# Patient Record
Sex: Female | Born: 1960 | Race: Asian | Hispanic: No | Marital: Single | Smoking: Never smoker
Health system: Southern US, Community
[De-identification: ages and names within clinical notes are randomized; demographics above are authoritative.]

## PROBLEM LIST (undated history)

## (undated) ENCOUNTER — Ambulatory Visit (HOSPITAL_COMMUNITY): Payer: PRIVATE HEALTH INSURANCE

## (undated) DIAGNOSIS — Z8041 Family history of malignant neoplasm of ovary: Secondary | ICD-10-CM

## (undated) HISTORY — DX: Family history of malignant neoplasm of ovary: Z80.41

---

## 2001-01-07 ENCOUNTER — Emergency Department (HOSPITAL_COMMUNITY): Admission: EM | Admit: 2001-01-07 | Discharge: 2001-01-07 | Payer: Self-pay | Admitting: Emergency Medicine

## 2001-04-10 ENCOUNTER — Ambulatory Visit (HOSPITAL_COMMUNITY): Admission: RE | Admit: 2001-04-10 | Discharge: 2001-04-10 | Payer: Self-pay | Admitting: *Deleted

## 2001-08-17 ENCOUNTER — Inpatient Hospital Stay (HOSPITAL_COMMUNITY): Admission: AD | Admit: 2001-08-17 | Discharge: 2001-08-17 | Payer: Self-pay | Admitting: Obstetrics

## 2001-08-22 ENCOUNTER — Inpatient Hospital Stay (HOSPITAL_COMMUNITY): Admission: AD | Admit: 2001-08-22 | Discharge: 2001-08-25 | Payer: Self-pay | Admitting: *Deleted

## 2010-09-20 ENCOUNTER — Encounter: Payer: Self-pay | Admitting: Occupational Therapy

## 2012-01-23 ENCOUNTER — Emergency Department (HOSPITAL_COMMUNITY)
Admission: EM | Admit: 2012-01-23 | Discharge: 2012-01-23 | Disposition: A | Discharge status: Home / Self Care | Payer: Self-pay | Attending: Emergency Medicine | Admitting: Emergency Medicine

## 2012-01-23 ENCOUNTER — Encounter (HOSPITAL_COMMUNITY): Payer: Self-pay | Admitting: *Deleted

## 2012-01-23 DIAGNOSIS — K0889 Other specified disorders of teeth and supporting structures: Secondary | ICD-10-CM

## 2012-01-23 DIAGNOSIS — K029 Dental caries, unspecified: Secondary | ICD-10-CM | POA: Insufficient documentation

## 2012-01-23 DIAGNOSIS — F17200 Nicotine dependence, unspecified, uncomplicated: Secondary | ICD-10-CM | POA: Insufficient documentation

## 2012-01-23 MED ORDER — PENICILLIN V POTASSIUM 250 MG PO TABS: 250.0000 mg | ORAL_TABLET | Freq: Four times a day (QID) | ORAL | Status: AC

## 2012-01-23 MED ORDER — HYDROCODONE-ACETAMINOPHEN 5-325 MG PO TABS: 2.0000 | ORAL_TABLET | ORAL | Status: AC | PRN

## 2012-01-23 MED ORDER — IBUPROFEN 600 MG PO TABS: 600.0000 mg | ORAL_TABLET | Freq: Four times a day (QID) | ORAL | Status: AC | PRN

## 2012-01-23 NOTE — ED Notes (Signed)
The pt has had a toothache for 2 weeks 

## 2012-01-23 NOTE — ED Provider Notes (Signed)
History     CSN: [id number]  Arrival date & time 01/23/12  2146   First MD Initiated Contact with Patient 01/23/12 2217      Chief Complaint  Patient presents with  . Dental Pain     HPI Seven-day history of dental pain.  Has seen her dentist and schedule for a procedure in the next week.  No fever.  Increasing pain not relieved by Aleve.  No swelling or difficulty swallowing.  No difficulty breathing. History reviewed. No pertinent past medical history.  History reviewed. No pertinent past surgical history.  No family history on file.  History  Substance Use Topics  . Smoking status: Current Everyday Smoker  . Smokeless tobacco: Not on file  . Alcohol Use: No    OB History    Grav Para Term Preterm Abortions TAB SAB Ect Mult Living                  Review of Systems  All other systems reviewed and are negative.    Allergies  Review of patient's allergies indicates no known allergies.  Home Medications   Current Outpatient Rx  Name Route Sig Dispense Refill  . ASPIRIN EC 81 MG PO TBEC Oral Take 81 mg by mouth every 4 (four) hours as needed. For tooth pain    . NAPROXEN SODIUM 220 MG PO TABS Oral Take 220 mg by mouth 2 (two) times daily with a meal. For pain    . HYDROCODONE-ACETAMINOPHEN 5-325 MG PO TABS Oral Take 2 tablets by mouth every 4 (four) hours as needed for pain. 6 tablet 0  . IBUPROFEN 600 MG PO TABS Oral Take 1 tablet (600 mg total) by mouth every 6 (six) hours as needed for pain. 30 tablet 0  . PENICILLIN V POTASSIUM 250 MG PO TABS Oral Take 1 tablet (250 mg total) by mouth 4 (four) times daily. 40 tablet 0    BP 124/75  Pulse 80  Temp(Src) 98.2 F (36.8 C) (Oral)  Resp 18  SpO2 97%  Physical Exam  Nursing note and vitals reviewed. Constitutional: She is oriented to person, place, and time. She appears well-developed and well-nourished. No distress.  HENT:  Head: Normocephalic and atraumatic.  Mouth/Throat:    Eyes: Pupils are equal,  round, and reactive to light.  Neck: Normal range of motion.  Cardiovascular: Normal rate and intact distal pulses.   Pulmonary/Chest: No respiratory distress.  Abdominal: Normal appearance. She exhibits no distension.  Musculoskeletal: Normal range of motion.  Neurological: She is alert and oriented to person, place, and time. No cranial nerve deficit.  Skin: Skin is warm and dry. No rash noted.  Psychiatric: She has a normal mood and affect. Her behavior is normal.    ED Course  Procedures (including critical care time)  Labs Reviewed - No data to display No results found.   1. Toothache   2. Dental caries       MDM         Nelia Shi, MD 01/23/12 2233

## 2012-01-23 NOTE — ED Notes (Addendum)
Patient complaining of a toothache for the last two weeks in right lower portion of mouth; patient states that he has not been seen by a dentist and is trying to make an appointment.  Rates pain 10/10 on the numerical pain scale; describes pain as "throbbing".  Patient alert and oriented x4; PERRL present.  Will continue to monitor.

## 2012-01-23 NOTE — Discharge Instructions (Signed)
?  au R?ng (Dental Pain) B?n ? ???c Bc s? khm v b? ?au r?ng. ?au r?ng c th? do su r?ng gy ra, su r?ng lm dy th?n kinh c?a r?ng ti?p xc v?i khng kh v nhi?t ?? nng hay l?nh, do ? gy ?au. ?au r?ng c th? l do nhi?m trng ho?c p xe (cn ???c g?i l s?ng hay ung nh?t) xung quanh r?ng c?a b?n, c?ng th??ng do su r?ng gy ra. Nhi?m trng hay p xe quanh r?ng lm cho b?n b? ?au r?ng. CH?N ?ON Bc s? c th? ch?n ?on ?au r?ng th??ng b?ng cch khm r?ng. ?I?U TR?  N?u ?au r?ng do nhi?m trng, c th? dng thu?c khng sinh (thu?c gi?t vi trng) v thu?c gi?m ?au theo toa c?a Bc s? ?? ?i?u tr?. Hy u?ng thu?c theo h??ng d?n.   Ch? dng cc thu?c ???c bn khng c?n ??n thu?c c?a Bc s? ho?c theo toa c?a Bc s? ?? gi?m ?au, kh ch?u, hay s?t theo nh? h??ng d?n c?a Bc s?Theodoro Grist d b?nh ?au r?ng ngy hm nay l do nhi?m trng hay do b?nh l v? r?ng, hy nn ??n Nha s? khm cng s?m cng t?t ?? ???c ch?m [place] thm.  ?I KHM B?NH N?U: Khm v ?i?u tr? ?au r?ng cho b?n hm nay ch? l x? tr c?p c?u. ?y khng ph?i l bi?n php thay th? ch?m [place] s?c kh?e ho?c ch?m [place] r?ng ??y ??. N?u b?nh c?a b?n ngy cng n?ng h?n hay cc tri?u ch?ng (v?n ??) m?i xu?t hi?n, v b?n khng th? thu x?p ?? ti khm ngay v?i Nha s? c?a b?n, hy g?i ?i?n tho?i ho?c tr? l?i phng khm ny. TM ??N CH?M [place] Y T? NGAY L?P T?C N?U:  B?n b? s?t.   ?? v ph n? vng m?t, hm, hay c?.   Khng th? m? mi?ng ???c.   ?au d? d?i khng gi?m v?i thu?c gi?m ?au.  HY CH?C CH?N R?NG B?N:  Hi?u r nh?ng h??ng d?n khi xu?t vi?n.   S? theo di tnh tr?ng b?nh c?a b?n.   S? ??n khm b?nh ngay l?p t?c nh? ? ???c h??ng d?n.  Document Released: 08/16/2005 Document Revised: 08/05/2011 [facility] Patient Information 2012 [place], [place].

## 2015-02-28 HISTORY — PX: NO PAST SURGERIES: SHX2092

## 2015-03-14 ENCOUNTER — Encounter (HOSPITAL_COMMUNITY): Payer: Self-pay | Admitting: *Deleted

## 2015-03-14 ENCOUNTER — Emergency Department (HOSPITAL_COMMUNITY)
Admission: EM | Admit: 2015-03-14 | Discharge: 2015-03-14 | Disposition: A | Discharge status: Home / Self Care | Payer: PRIVATE HEALTH INSURANCE | Attending: Emergency Medicine | Admitting: Emergency Medicine

## 2015-03-14 ENCOUNTER — Emergency Department (HOSPITAL_COMMUNITY): Payer: PRIVATE HEALTH INSURANCE

## 2015-03-14 DIAGNOSIS — Z7982 Long term (current) use of aspirin: Secondary | ICD-10-CM | POA: Diagnosis not present

## 2015-03-14 DIAGNOSIS — E041 Nontoxic single thyroid nodule: Secondary | ICD-10-CM | POA: Diagnosis not present

## 2015-03-14 DIAGNOSIS — Z720 Tobacco use: Secondary | ICD-10-CM | POA: Diagnosis not present

## 2015-03-14 DIAGNOSIS — Z791 Long term (current) use of non-steroidal anti-inflammatories (NSAID): Secondary | ICD-10-CM | POA: Diagnosis not present

## 2015-03-14 DIAGNOSIS — M542 Cervicalgia: Secondary | ICD-10-CM | POA: Diagnosis present

## 2015-03-14 LAB — I-STAT CHEM 8, ED
BUN: 14 mg/dL (ref 6–20)
Calcium, Ion: 1.27 mmol/L — ABNORMAL HIGH (ref 1.12–1.23)
Chloride: 106 mmol/L (ref 101–111)
Creatinine, Ser: 0.7 mg/dL (ref 0.44–1.00)
Glucose, Bld: 95 mg/dL (ref 65–99)
HCT: 45 % (ref 36.0–46.0)
Hemoglobin: 15.3 g/dL — ABNORMAL HIGH (ref 12.0–15.0)
Potassium: 4.1 mmol/L (ref 3.5–5.1)
Sodium: 142 mmol/L (ref 135–145)
TCO2: 23 mmol/L (ref 0–100)

## 2015-03-14 MED ORDER — IOHEXOL 300 MG/ML  SOLN
75.0000 mL | Freq: Once | INTRAMUSCULAR | Status: AC | PRN
  Administered 2015-03-14: 75 mL via INTRAVENOUS

## 2015-03-14 NOTE — ED Notes (Signed)
Pt reports her neck hurts a little when she turns head to RT.

## 2015-03-14 NOTE — Discharge Instructions (Signed)
Thyroid Cyst °The thyroid gland is a butterfly-shaped gland in the middle of the neck, located just below the voice box. It makes thyroid hormone. Thyroid hormone has an effect on nearly all tissues of your body by regulating your metabolism. Metabolism is the breakdown and use of food that you eat or energy that is stored in your body. Your metabolism affects your heart rate, blood pressure, body temperature, and weight.  °Thyroid cysts are enlarged fluid filled regions of the thyroid gland. These cysts range in size and may expand and enlarge suddenly. Rapidly expanding cysts may cause pain, difficulty swallowing, and rarely, difficulty breathing. Most cysts of the thyroid are not cancerous (benign). °SYMPTOMS °Bleeding may occur within the cyst. If the bleeding is severe, the cyst may get larger and produce problems in the neck, including swelling that may produce pain and difficultly swallowing. If the vocal cords are compressed, hoarseness may occur. If the windpipe is compressed, you may have difficulty breathing. °DIAGNOSIS  °A thyroid cyst is diagnosed through physical exam. The diagnosis can be confirmed by an ultrasound exam of the neck. This creates a picture by bouncing sound waves off the thyroid gland. Sometimes the cysts are drained using a fine needle. The fluid is then sent to the lab where it can be examined. This is done to see if any cells in the fluid are cancerous. If they are found to be cancerous, you will need further treatment.  °TREATMENT  °If the fluid in your neck does not show evidence of cancer, your caregiver may just want to monitor you with yearly ultrasound exams. Sometimes cysts need to be removed surgically. °Document Released: 07/09/2004 Document Revised: 11/08/2011 Document Reviewed: 10/22/2010 °ExitCare® Patient Information ©2015 [employer]. This information is not intended to replace advice given to you by your health care provider. Make sure you discuss any questions you  have with your health care provider. ° °

## 2015-03-14 NOTE — Discharge Planning (Signed)
NCM set up appointment on July 19 at 1000 with Kristian CoveyShane Tysinger at:  [facility] [address]  Information relayed to pt and pt daughter at bedside.

## 2015-03-14 NOTE — Discharge Planning (Signed)
NCM consulted to set pt up with follow-up appointment for thyroid nodule.  [facility](Jessica Beck, RN) to see if this pt is appropriate for TCC and or PCP establishment.

## 2015-03-14 NOTE — ED Provider Notes (Signed)
CSN: [id number]     Arrival date & time 03/14/15  4098 History   This chart was scribed for non-physician practitioner Teressa Lower, NP working with Cathren Laine, MD by Lyndel Safe, ED Scribe. This patient was seen in room TR06C/TR06C and the patient's care was started at 9:46 AM.      Chief Complaint  Patient presents with  . Neck Pain   HPI  HPI Comments: Lisa Robinson is a 54 y.o. female, with no pertinent PMhx, who presents to the Emergency Department complaining of sudden onset, constant, moderate, anterior, left-sided neck pain with an area of mild swelling onset 5 days ago. She reports the pain is exacerbated with lateral movement of her neck. Pt is not followed by a PCP.  Pt denies an injury attributable to the pain. Denies fever, pain or trouble swallowing, dyspnea, or SOB.   History reviewed. No pertinent past medical history. History reviewed. No pertinent past surgical history. History reviewed. No pertinent family history. History  Substance Use Topics  . Smoking status: Current Every Day Smoker  . Smokeless tobacco: Never Used  . Alcohol Use: No   OB History    No data available     Review of Systems  Constitutional: Negative for fever.  HENT: Negative for trouble swallowing.   Respiratory: Negative for shortness of breath.   Musculoskeletal: Positive for neck pain.    Allergies  Review of patient's allergies indicates no known allergies.  Home Medications   Prior to Admission medications   Medication Sig Start Date End Date Taking? Authorizing Provider  acetaminophen (TYLENOL) 500 MG tablet Take 500 mg by mouth every 6 (six) hours as needed for mild pain.   Yes Historical Provider, MD  aspirin EC 81 MG tablet Take 81 mg by mouth every 4 (four) hours as needed. For tooth pain    Historical Provider, MD  naproxen sodium (ANAPROX) 220 MG tablet Take 220 mg by mouth 2 (two) times daily with a meal. For pain    Historical Provider, MD   BP 118/81 mmHg  Pulse  82  Temp(Src) 97.8 F (36.6 C) (Oral)  Resp 16  SpO2 94% Physical Exam  Constitutional: She appears well-developed and well-nourished.  HENT:  Head: Normocephalic and atraumatic.  Eyes: Conjunctivae are normal. Right eye exhibits no discharge. Left eye exhibits no discharge.  Neck:  Swelling noted to the left neck.  Pulmonary/Chest: Effort normal. No respiratory distress.  Abdominal: Soft. Bowel sounds are normal. She exhibits no distension.  Neurological: She is alert. Coordination normal.  Skin: Skin is warm and dry. No rash noted. She is not diaphoretic. No erythema.  Psychiatric: She has a normal mood and affect.  Nursing note and vitals reviewed.   ED Course  Procedures  DIAGNOSTIC STUDIES: Oxygen Saturation is 94% on RA, adequate by my interpretation.    COORDINATION OF CARE: 9:48 AM Discussed treatment plan which includes to order a CT soft tissue of neck. Pt acknowledges and agrees to plan.   Labs Review Labs Reviewed  I-STAT CHEM 8, ED - Abnormal; Notable for the following:    Calcium, Ion 1.27 (*)    Hemoglobin 15.3 (*)    All other components within normal limits    Imaging Review Ct Soft Tissue Neck W Contrast  03/14/2015   CLINICAL DATA:  Pain when turning head to the LEFT.  EXAM: CT NECK WITH CONTRAST  TECHNIQUE: Multidetector CT imaging of the neck was performed using the standard protocol following the bolus administration  of intravenous contrast.  CONTRAST:  75mL OMNIPAQUE IOHEXOL 300 MG/ML  SOLN  COMPARISON:  None.  FINDINGS: Pharynx and larynx: Normal.  Salivary glands: Normal.  Thyroid: LEFT-greater-than-RIGHT thyroid lobe enlargement. Of the thyroid gland appears hyper dense and there are cystic lesions bilaterally that merit further evaluation with thyroid ultrasound. This can be performed on a nonemergent outpatient basis. Large septated cystic lesion is present in the LEFT thyroid lobe that measures 5 cm craniocaudal, 2.5 cm AP and 2.3 cm transverse. The  largest RIGHT-sided nodule measures 9 mm x 8 mm by 13 mm. Mural enhancement is present in these nodules.  Lymph nodes: None.  Vascular: Normal.  Limited intracranial: Normal.  Visualized orbits: Normal.  Mastoids and visualized paranasal sinuses: Normal.  Skeleton: Normal.  Upper chest: Dependent atelectasis in the lungs. The aortic arch appears normal. No visible mediastinal adenopathy.  IMPRESSION: LEFT-greater-than-RIGHT thyroid lobe enlargement with nonspecific cystic lesions bilaterally. The largest is on the LEFT in measures 5 cm craniocaudal. Follow-up thyroid ultrasound is recommended for further characterization.   Electronically Signed   By: Andreas NewportGeoffrey  Lamke M.D.   On: 03/14/2015 12:01    MDM   Final diagnoses:  Thyroid nodule    Discussed with pt the findings while using the translator. Discussed the possibility of cancer. Made an appointment with pcp so that pt can have ultrasound and biopsy  I personally performed the services described in this documentation, which was scribed in my presence. The recorded information has been reviewed and is accurate.    Teressa LowerVrinda Jaquanda Wickersham, NP 03/14/15 1424  Cathren LaineKevin Steinl, MD 03/15/15 [phone]

## 2015-03-18 ENCOUNTER — Ambulatory Visit (INDEPENDENT_AMBULATORY_CARE_PROVIDER_SITE_OTHER): Payer: PRIVATE HEALTH INSURANCE | Admitting: Medical

## 2015-03-18 ENCOUNTER — Encounter: Payer: Self-pay | Admitting: Medical

## 2015-03-18 VITALS — BP 110/60 | HR 75 | Temp 97.5°F | Resp 14 | Wt 116.0 lb

## 2015-03-18 DIAGNOSIS — E041 Nontoxic single thyroid nodule: Secondary | ICD-10-CM | POA: Diagnosis not present

## 2015-03-18 DIAGNOSIS — Z7189 Other specified counseling: Secondary | ICD-10-CM | POA: Diagnosis not present

## 2015-03-18 LAB — CBC WITH DIFFERENTIAL/PLATELET
Basophils Absolute: 0.1 10*3/uL (ref 0.0–0.1)
Basophils Relative: 1 % (ref 0–1)
EOS PCT: 4 % (ref 0–5)
Eosinophils Absolute: 0.3 10*3/uL (ref 0.0–0.7)
HCT: 38.1 % (ref 36.0–46.0)
Hemoglobin: 12.6 g/dL (ref 12.0–15.0)
Lymphocytes Relative: 32 % (ref 12–46)
Lymphs Abs: 2 10*3/uL (ref 0.7–4.0)
MCH: 28.6 pg (ref 26.0–34.0)
MCHC: 33.1 g/dL (ref 30.0–36.0)
MCV: 86.6 fL (ref 78.0–100.0)
MPV: 9.5 fL (ref 8.6–12.4)
Monocytes Absolute: 0.2 10*3/uL (ref 0.1–1.0)
Monocytes Relative: 3 % (ref 3–12)
Neutro Abs: 3.8 10*3/uL (ref 1.7–7.7)
Neutrophils Relative %: 60 % (ref 43–77)
PLATELETS: 279 10*3/uL (ref 150–400)
RBC: 4.4 MIL/uL (ref 3.87–5.11)
RDW: 12.8 % (ref 11.5–15.5)
WBC: 6.3 10*3/uL (ref 4.0–10.5)

## 2015-03-18 LAB — COMPREHENSIVE METABOLIC PANEL
AST: 16 U/L (ref 0–37)
Albumin: 3.5 g/dL (ref 3.5–5.2)
Alkaline Phosphatase: 88 U/L (ref 39–117)
BUN: 12 mg/dL (ref 6–23)
CO2: 27 mEq/L (ref 19–32)
CREATININE: 0.72 mg/dL (ref 0.50–1.10)
Calcium: 9 mg/dL (ref 8.4–10.5)
Chloride: 107 mEq/L (ref 96–112)
GLUCOSE: 83 mg/dL (ref 70–99)
Potassium: 4.8 mEq/L (ref 3.5–5.3)
Sodium: 142 mEq/L (ref 135–145)
Total Bilirubin: 0.5 mg/dL (ref 0.2–1.2)
Total Protein: 6.8 g/dL (ref 6.0–8.3)

## 2015-03-18 NOTE — Patient Instructions (Addendum)
Thyroid nodule  Recommendations: We are checking labs We will set you up for a thyroid ultrasound Return soon for a physical You will need a mammogram, a pap smear, and colonoscopy for cancer screening  You will need additional labs for screening at your physical

## 2015-03-18 NOTE — Progress Notes (Signed)
Subjective: Here as a new patient.   accompanied by her daughter Evangeline GulaRanine and daughter in law who helps translate.   She is originally from [place], moved here 13 years ago.   She has been in usual state of health until about 1.5 weeks ago when she noticed swelling in neck at the thyroid gland, went to the ED, had CT scan, labs, and referred here for f/u.  She has not had routine health care, no prior cancer screening including no prior mammogram, pap or colonoscopy.  She has been in good health without c/o.   No hx/o radiation to head or neck.   She is a nonsmoker, no alcohol consumption.  She has had some headaches, but otherwise no symptoms. No other aggravating or relieving factors. No other complaint.   Past Medical History  Diagnosis Date  . Family history of ovarian cancer     Past Surgical History  Procedure Laterality Date  . No past surgeries  02/2015    History   Social History  . Marital Status: Single    Spouse Name: N/A  . Number of Children: N/A  . Years of Education: N/A   Occupational History  . Not on file.   Social History Main Topics  . Smoking status: Never Smoker   . Smokeless tobacco: Never Used  . Alcohol Use: No  . Drug Use: No  . Sexual Activity: Not on file   Other Topics Concern  . 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  Exercises some.  Has daughter and 2 grandchildren.      Family History  Problem Relation Age of Onset  . Cancer Mother     ovarian  . Heart disease Father   . Lung disease Father   . Diabetes Neg Hx   . Stroke Neg Hx      Current outpatient prescriptions:  .  acetaminophen (TYLENOL) 500 MG tablet, Take 500 mg by mouth every 6 (six) hours as needed for mild pain., Disp: , Rfl:  .  aspirin EC 81 MG tablet, Take 81 mg by mouth every 4 (four) hours as needed. For tooth pain, Disp: , Rfl:  .  naproxen sodium (ANAPROX) 220 MG tablet,  Take 220 mg by mouth 2 (two) times daily with a meal. For pain, Disp: , Rfl:   No Known Allergies  Review of Systems Constitutional: -fever, -chills, -sweats, -unexpected weight change,-fatigue ENT: -runny nose, -ear pain, -sore throat Cardiology:  -chest pain, -palpitations, -edema Respiratory: -cough, -shortness of breath, -wheezing Gastroenterology: -abdominal pain, -nausea, -vomiting, -diarrhea, -constipation Hematology: -bleeding or bruising problems Musculoskeletal: -arthralgias, -myalgias, -joint swelling, -back pain Ophthalmology: -vision changes Urology: -dysuria, -difficulty urinating, -hematuria, -urinary frequency, -urgency Neurology: +headache, -weakness, -tingling, -numbness     Objective: BP 110/60 mmHg  Pulse 75  Temp(Src) 97.5 F (36.4 C) (Oral)  Resp 14  Wt 116 lb (52.617 kg)  General appearance: alert, no distress, WD/WN, pleasant [place] female HEENT: normocephalic, sclerae anicteric, PERRLA, EOMi, nares patent, no discharge or erythema, pharynx normal Oral cavity: MMM, no lesions Neck: supple, left thyroid with fullness and enlargement of the thyroid tissue with possible nodule, definite right thyroid nodule 1+ cm diameter, no lymphadenopathy Heart: RRR, normal S1, S2, no murmurs Lungs: CTA bilaterally, no wheezes, rhonchi, or rales Abdomen: +bs, soft, non tender, non distended, no masses, no hepatomegaly, no splenomegaly Extremities: no edema, no cyanosis, no clubbing Pulses: 2+  symmetric, upper and lower extremities, normal cap refill Neurological: alert, oriented x 3, CN2-12 intact, strength normal upper extremities and lower extremities, sensation normal throughout, DTRs 2+ throughout, no cerebellar signs, gait normal Psychiatric: normal affect, behavior normal, pleasant     Assessment: Encounter Diagnoses  Name Primary?  . Thyroid nodule Yes  . Counseling on health promotion and disease prevention     Plan: Thyroid nodule - discussed exam  findings, the CT scan results and labs from the recent ED visit.  Discussed possible etiologies, but certainly could be thyroid cancer.   discussed next steps, labs today, [place] and likely biopsy in the near future.    Advised she return soon for baseline physical, screenings, updating vaccines, etc.   Discussed this with daughter and her, and she voices understanding

## 2015-03-19 LAB — TSH: TSH: 2.139 u[IU]/mL (ref 0.350–4.500)

## 2015-03-19 LAB — SEDIMENTATION RATE: Sed Rate: 45 mm/hr — ABNORMAL HIGH (ref 0–30)

## 2015-03-19 LAB — T3: T3, Total: 72.9 ng/dL — ABNORMAL LOW (ref 80.0–204.0)

## 2015-03-19 LAB — PTH, INTACT AND CALCIUM
Calcium: 9 mg/dL (ref 8.4–10.5)
PTH: 54 pg/mL (ref 14–64)

## 2015-03-19 LAB — T4, FREE: Free T4: 0.79 ng/dL — ABNORMAL LOW (ref 0.80–1.80)

## 2015-03-21 ENCOUNTER — Ambulatory Visit
Admission: RE | Admit: 2015-03-21 | Discharge: 2015-03-21 | Disposition: A | Discharge status: Home / Self Care | Payer: PRIVATE HEALTH INSURANCE | Source: Ambulatory Visit | Attending: Medical | Admitting: Medical

## 2015-03-21 ENCOUNTER — Ambulatory Visit: Payer: PRIVATE HEALTH INSURANCE | Admitting: Family Medicine

## 2015-03-21 DIAGNOSIS — E041 Nontoxic single thyroid nodule: Secondary | ICD-10-CM

## 2015-03-27 ENCOUNTER — Other Ambulatory Visit: Payer: Self-pay | Admitting: Otolaryngology

## 2015-03-27 ENCOUNTER — Other Ambulatory Visit (HOSPITAL_COMMUNITY)
Admission: RE | Admit: 2015-03-27 | Discharge: 2015-03-27 | Disposition: A | Discharge status: Home / Self Care | Payer: PRIVATE HEALTH INSURANCE | Source: Ambulatory Visit | Attending: Otolaryngology | Admitting: Otolaryngology

## 2015-03-27 DIAGNOSIS — E041 Nontoxic single thyroid nodule: Secondary | ICD-10-CM | POA: Insufficient documentation

## 2015-10-02 ENCOUNTER — Telehealth: Payer: Self-pay

## 2015-10-02 NOTE — Telephone Encounter (Signed)
Patient called to asked for documentation of the flu shot she received on 06/06/2016.  She is starting a new job and lost the paperwork she was given at the clinic. CN mailed requested document to patient.

## 2015-11-08 IMAGING — CT CT NECK W/ CM
4 of 5 series · 15 of 33 positions shown, 17 images · IV contrast (Omni 300)
Comparison: None.

CLINICAL DATA: Pain when turning head to the LEFT.

EXAM:
CT NECK WITH CONTRAST
TECHNIQUE: Multidetector CT imaging of the neck was performed using the
standard protocol following the bolus administration of intravenous
contrast.
CONTRAST:  75mL OMNIPAQUE IOHEXOL 300 MG/ML  SOLN

[Series 2: neck 2.0 i31s 3 · axial · 0.37mm/px · z∈[-286,-154]mm · 4 of 111 slices shown, 5 images]
[im 23/111  soft-tissue]
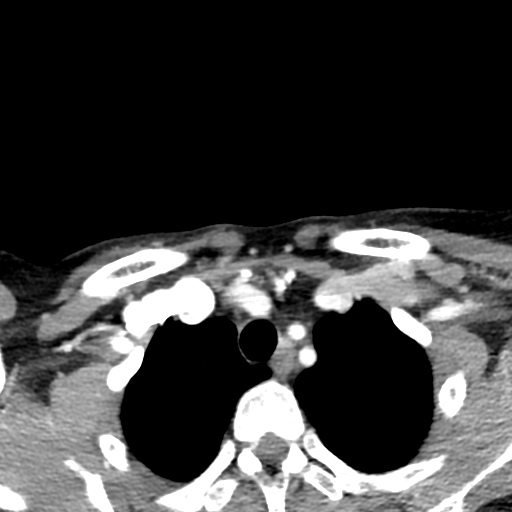
[im 23/111  bone]
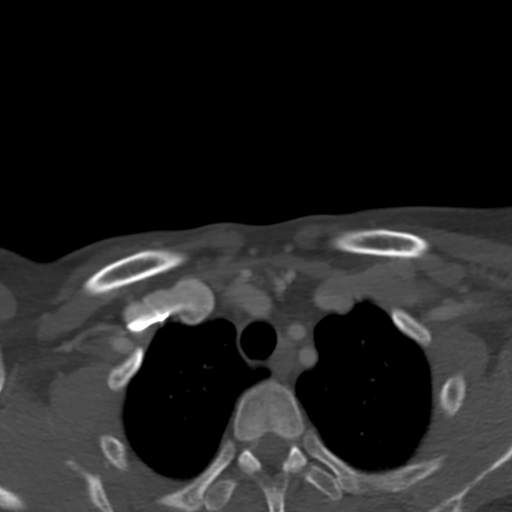
[im 45/111  bone]
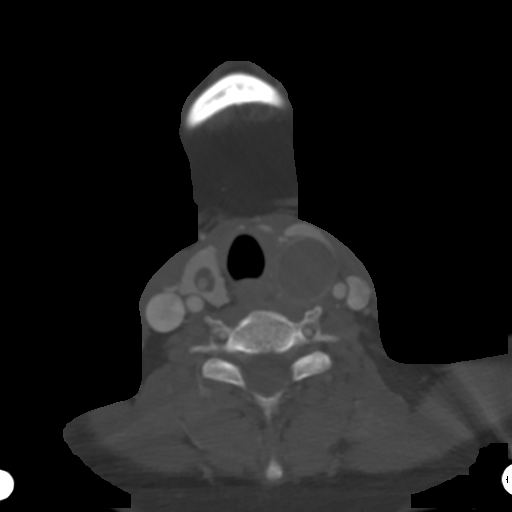
[im 67/111  bone]
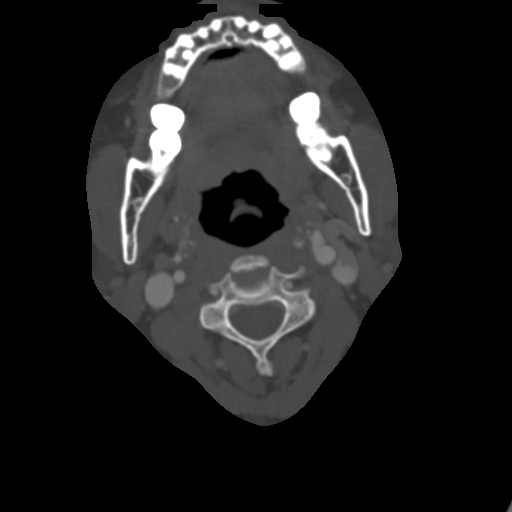
[im 89/111  bone]
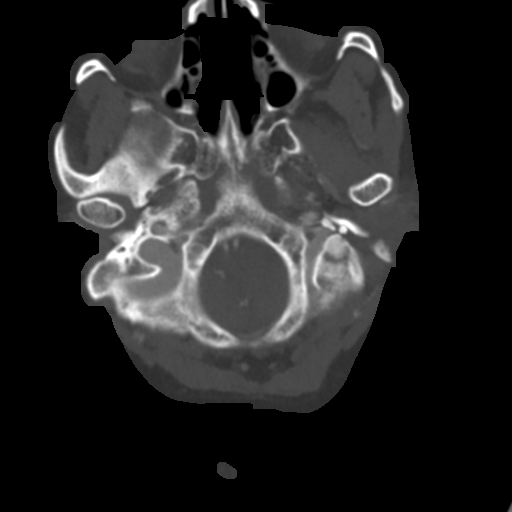

[Series 5: coronal st · coronal · 0.28mm/px · 3 of 68 slices shown]
[im 14/68  bone]
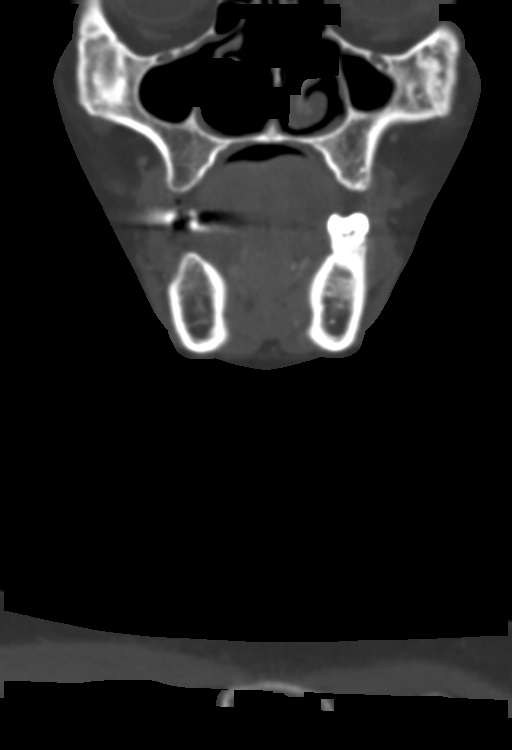
[im 27/68  bone]
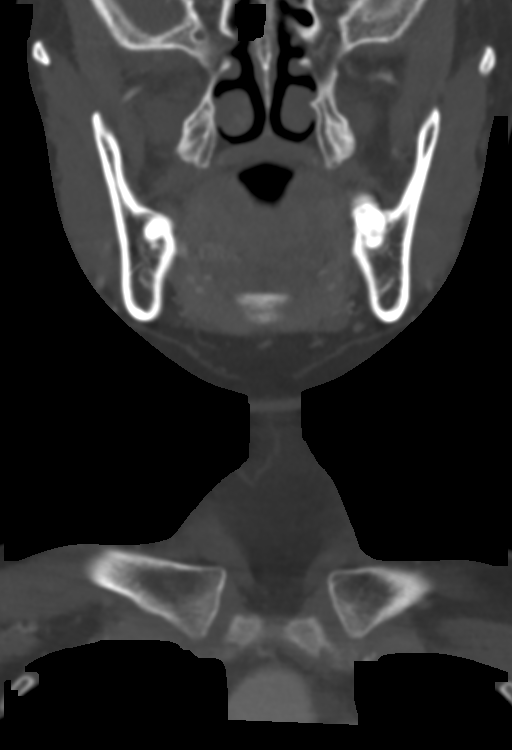
[im 41/68  bone]
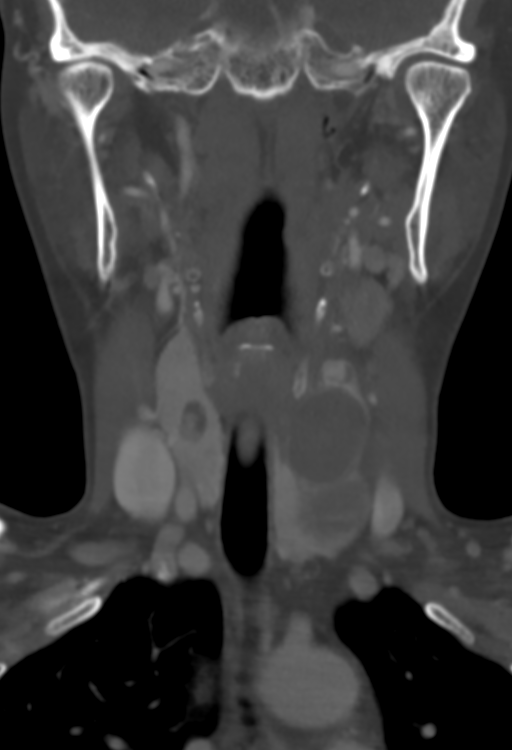

[Series 6: sagittal st · sagittal · 0.37mm/px · 5 of 56 slices shown, 6 images]
[im 19/56  bone]
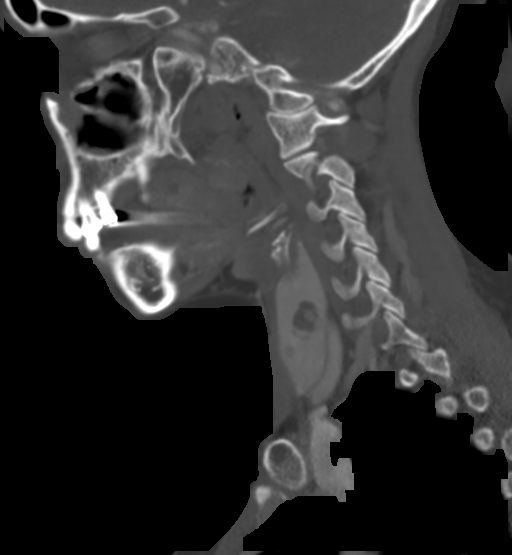
[im 23/56  bone]
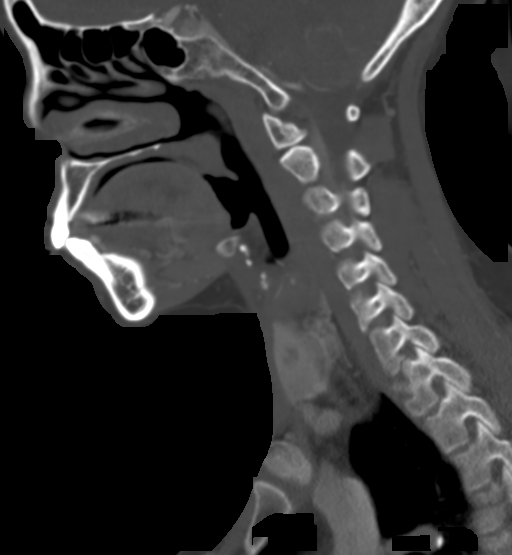
[im 28/56  soft-tissue]
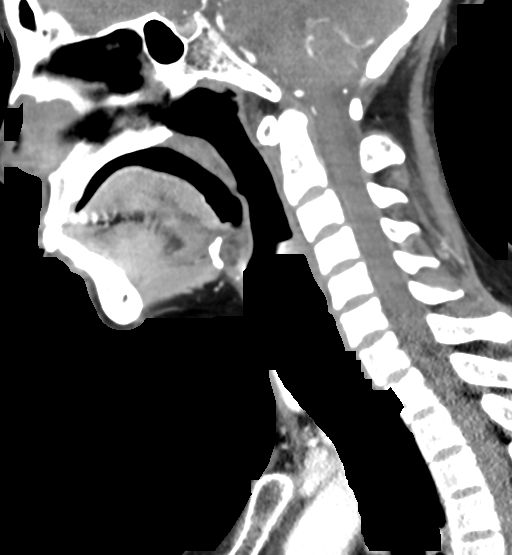
[im 28/56  bone]
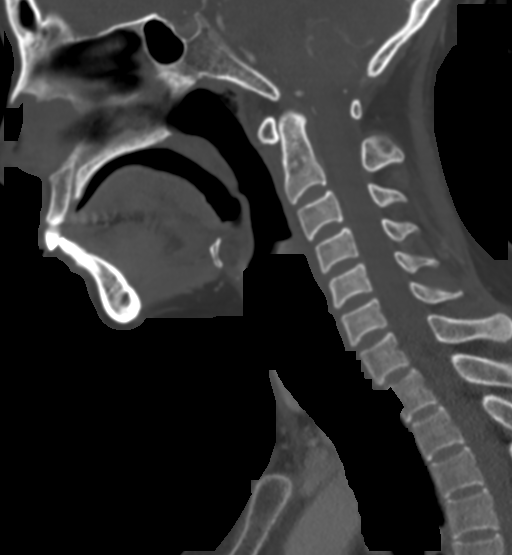
[im 33/56  bone]
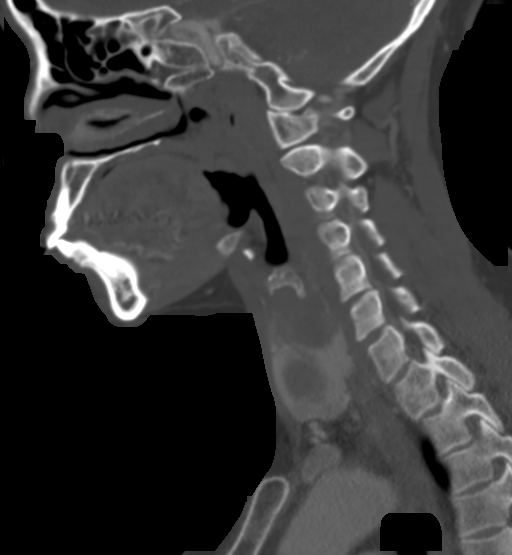
[im 37/56  bone]
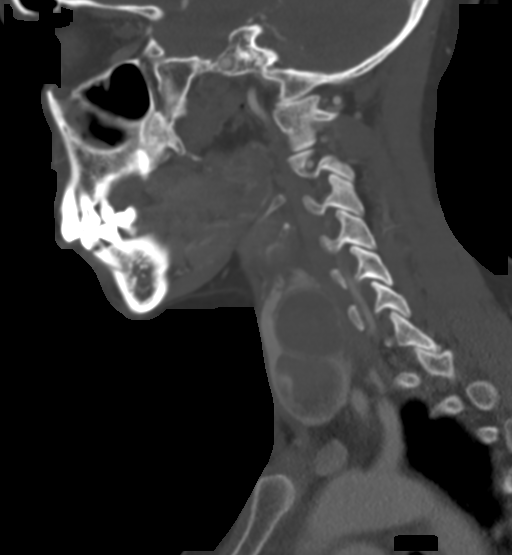

[Series 7: orthogonal st · axial · 0.26mm/px · z∈[-306,-225]mm · 3 of 105 slices shown]
[im 21/105  bone]
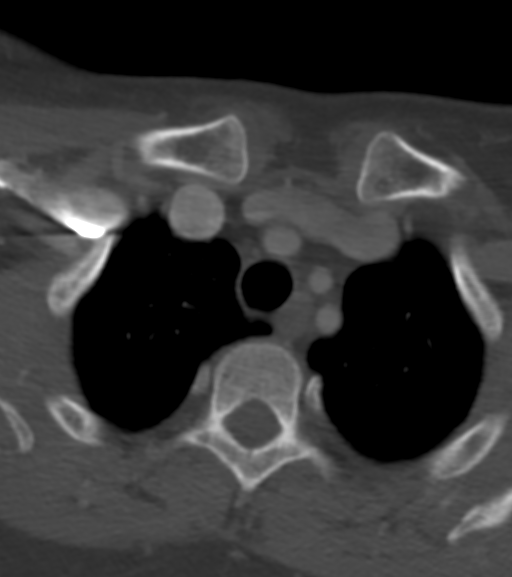
[im 42/105  bone]
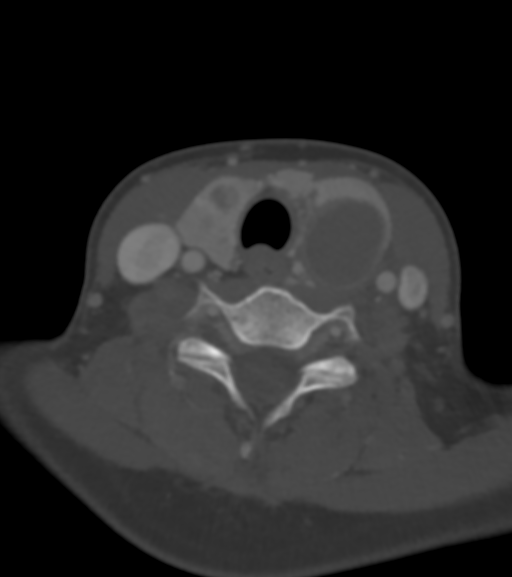
[im 63/105  bone]
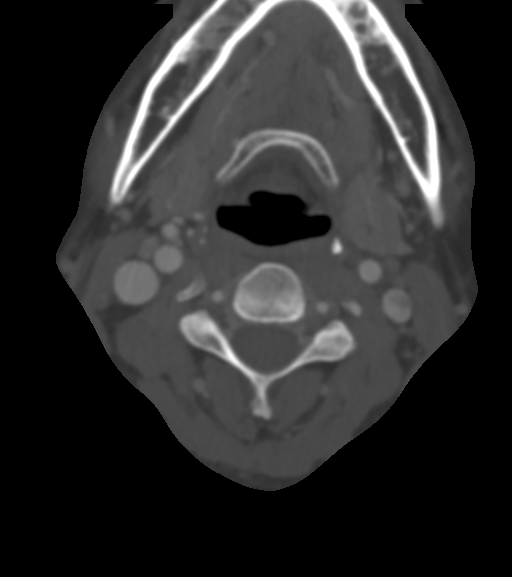

[15 of 33 positions shown; findings below may reference images not displayed]

FINDINGS: Pharynx and larynx: Normal.

Salivary glands: Normal.

Thyroid: LEFT-greater-than-RIGHT thyroid lobe enlargement. Of the
thyroid gland appears hyper dense and there are cystic lesions
bilaterally that merit further evaluation with thyroid ultrasound.
This can be performed on a nonemergent outpatient basis. Large
septated cystic lesion is present in the LEFT thyroid lobe that
measures 5 cm craniocaudal, 2.5 cm AP and 2.3 cm transverse. The
largest RIGHT-sided nodule measures 9 mm x 8 mm by 13 mm. Mural
enhancement is present in these nodules.

Lymph nodes: None.

Vascular: Normal.

Limited intracranial: Normal.

Visualized orbits: Normal.

Mastoids and visualized paranasal sinuses: Normal.

Skeleton: Normal.

Upper chest: Dependent atelectasis in the lungs. The aortic arch
appears normal. No visible mediastinal adenopathy.
IMPRESSION: LEFT-greater-than-RIGHT thyroid lobe enlargement with nonspecific
cystic lesions bilaterally. The largest is on the LEFT in measures 5
cm craniocaudal. Follow-up thyroid ultrasound is recommended for
further characterization.

## 2015-11-15 IMAGING — US US SOFT TISSUE HEAD/NECK
1 series · 13 of 25 positions shown · non-contrast
Comparison: 03/14/2015

CLINICAL DATA: 53-year-old female with a history of bilateral
thyroid lesions on head and neck CT.

EXAM:
THYROID ULTRASOUND
TECHNIQUE: Ultrasound examination of the thyroid gland and adjacent soft
tissues was performed.

[Series 1: us soft tissue head/neck · 0.08mm/px · 13 of 61 slices shown]
[im 1/61]
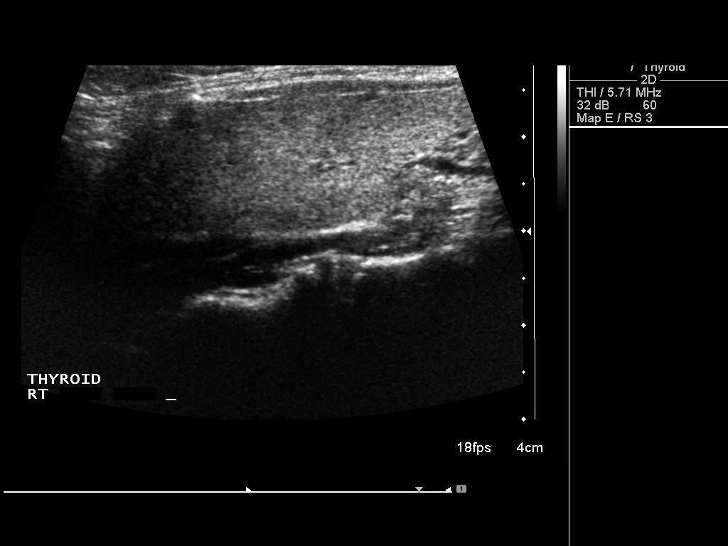
[im 6/61]
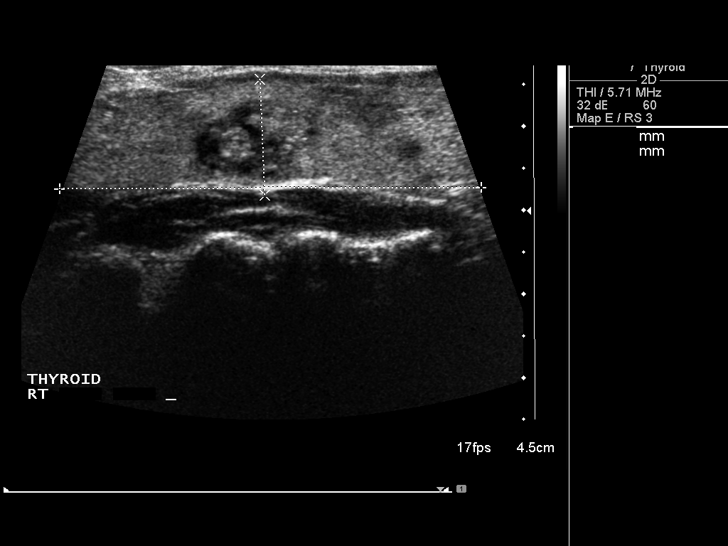
[im 11/61]
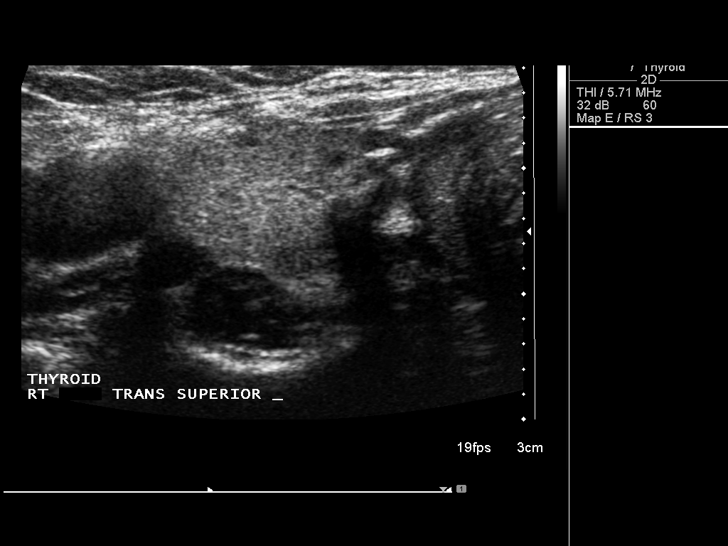
[im 16/61]
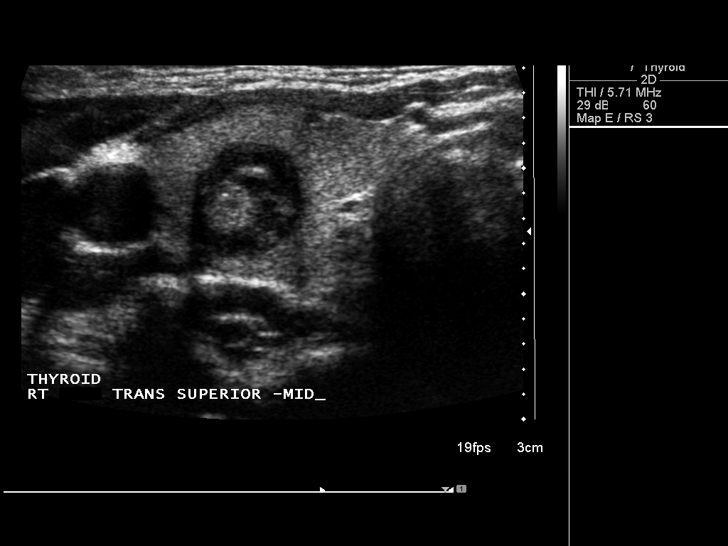
[im 21/61]
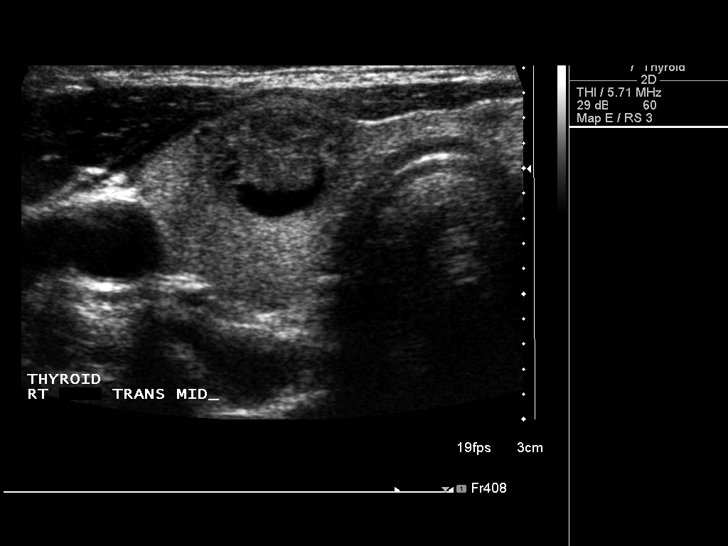
[im 26/61]
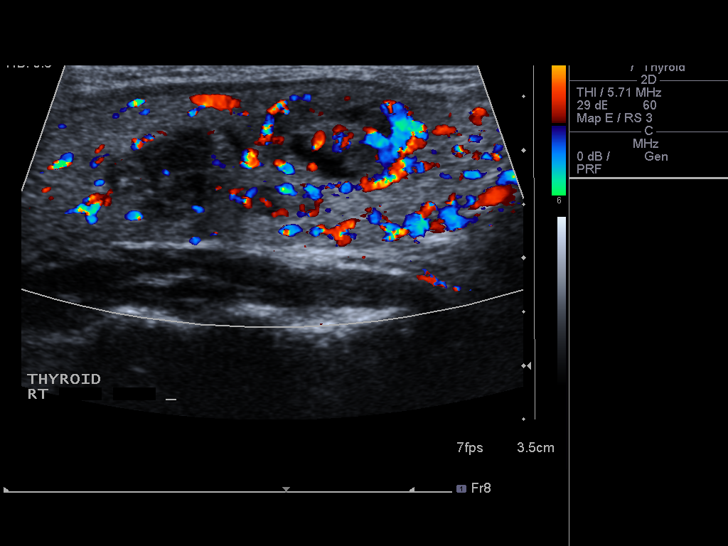
[im 31/61]
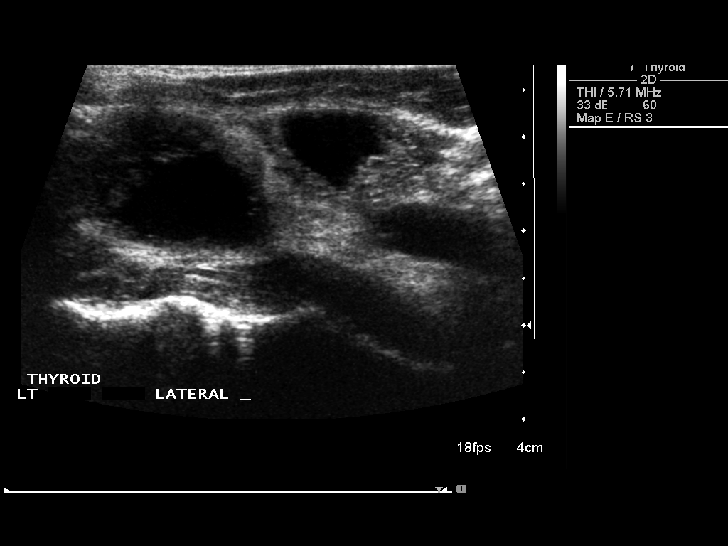
[im 36/61]
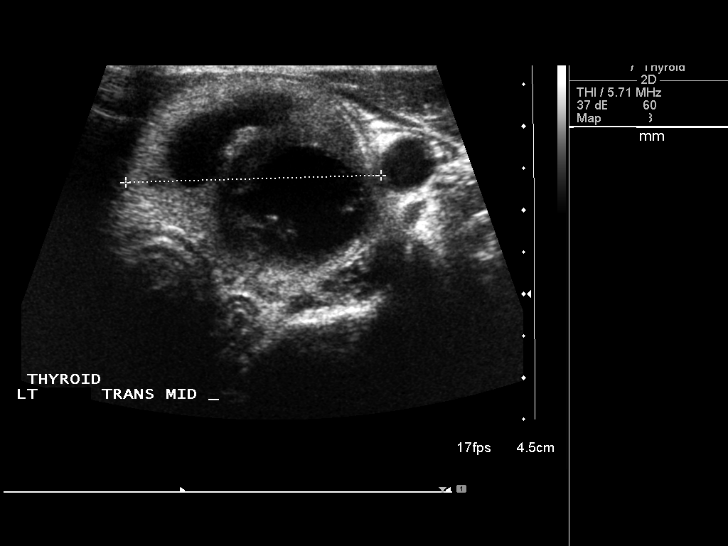
[im 41/61]
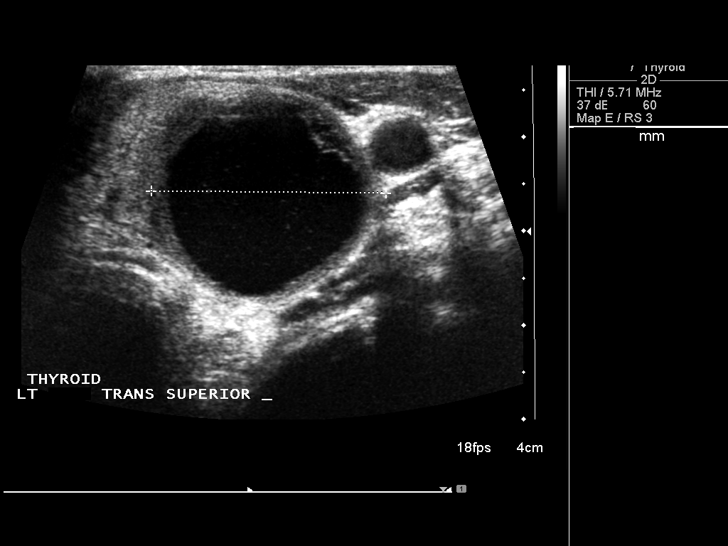
[im 46/61]
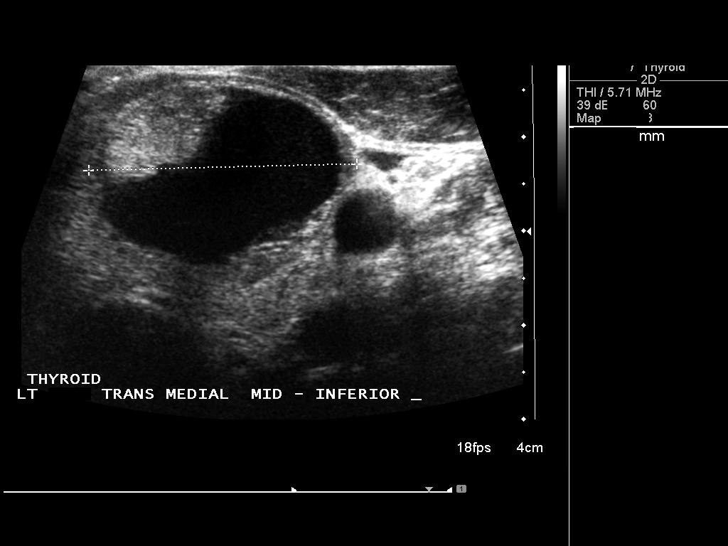
[im 51/61]
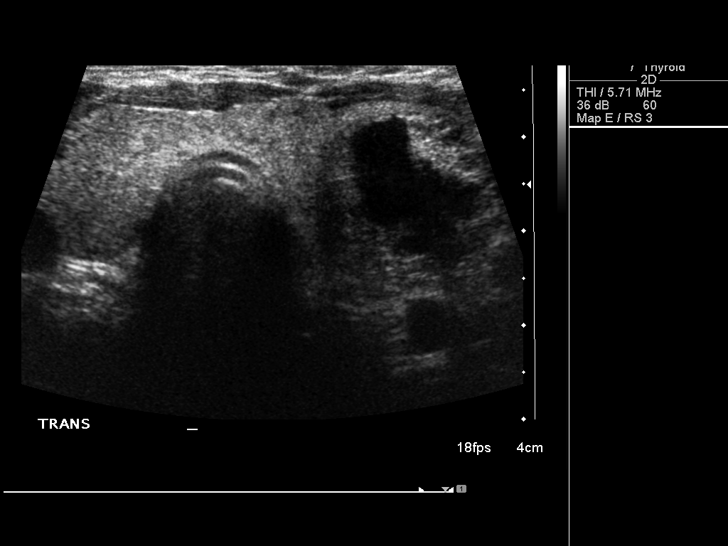
[im 56/61]
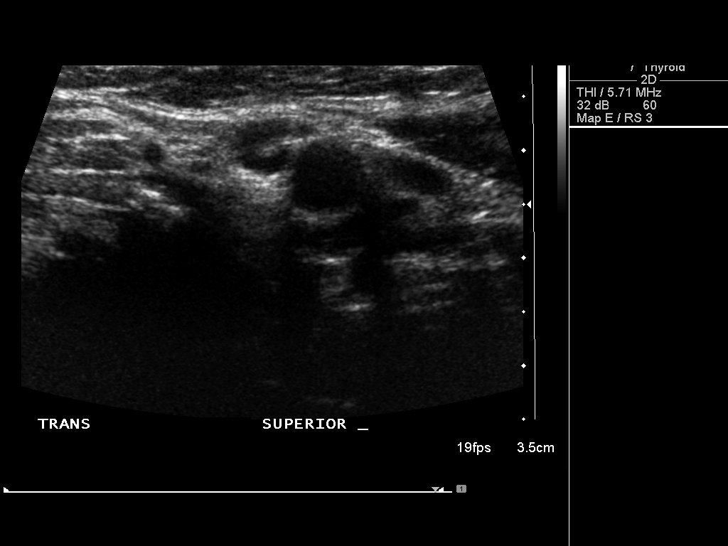
[im 61/61]
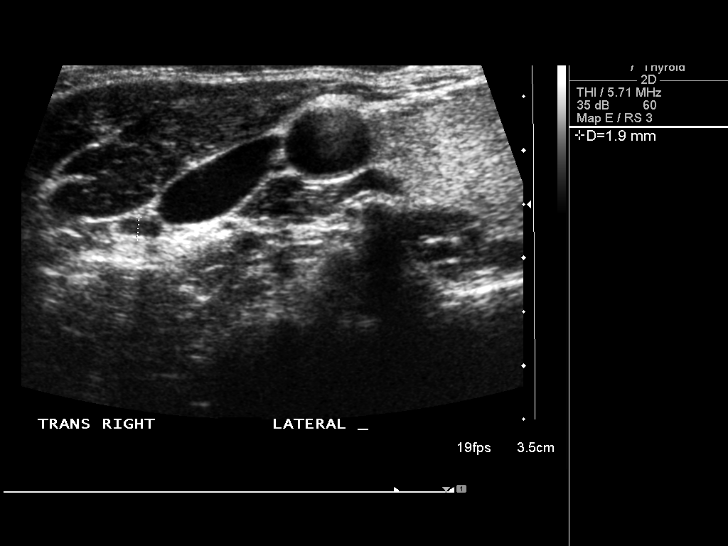

[13 of 25 positions shown; findings below may reference images not displayed]

FINDINGS: Right thyroid lobe

Measurements: 6.7 cm x 1.6 cm x 2.1 cm. Three separate nodules
measured on the right.

Superior measures 1.4 cm x 9 mm x 9 mm with internal reflectors.

Inferior measures 1.3 cm x 9 mm x 1.2 cm. Inferior demonstrates no
internal reflectors with margin maintained.

Additional small nodule on the right measures no greater than 7 mm.

Left thyroid lobe

Measurements: 6.1 cm x 2.5 cm x 3.1 cm. Nodule on the left
demonstrates cystic and solid changes. There are either 2 separate
lesions or 1 lesion with lobulations separated by septum. Superior
nodule measures 2.7 cm x 2.2 cm x 2.5 cm with cystic and solid
changes. Inferior measures 3.0 cm x 1.7 cm x 2.9 cm with cystic and
solid changes.

Isthmus

Thickness: 4 mm.  No nodules visualized.

Lymphadenopathy

None visualized.
IMPRESSION: Multinodular thyroid.

Right-sided nodule with internal reflectors meets criteria for
biopsy.

Left-sided thyroid demonstrates dominant nodule with cystic and
solid components, potentially 2 separate lesions or 1 lesion with a
septum. This also meets criteria for biopsy.

Ultrasound-guided fine needle aspiration should be considered, as
per the consensus statement: Management of Thyroid Nodules Detected
at US: Society of Radiologists in Ultrasound Consensus Conference

## 2018-02-10 ENCOUNTER — Other Ambulatory Visit: Payer: Self-pay

## 2018-02-10 ENCOUNTER — Emergency Department (HOSPITAL_COMMUNITY)
Admission: EM | Admit: 2018-02-10 | Discharge: 2018-02-11 | Disposition: A | Discharge status: Home / Self Care | Payer: BLUE CROSS/BLUE SHIELD | Attending: Emergency Medicine | Admitting: Emergency Medicine

## 2018-02-10 ENCOUNTER — Encounter (HOSPITAL_COMMUNITY): Payer: Self-pay | Admitting: Emergency Medicine

## 2018-02-10 DIAGNOSIS — Z5321 Procedure and treatment not carried out due to patient leaving prior to being seen by health care provider: Secondary | ICD-10-CM | POA: Diagnosis not present

## 2018-02-10 DIAGNOSIS — R109 Unspecified abdominal pain: Secondary | ICD-10-CM | POA: Diagnosis not present

## 2018-02-10 NOTE — ED Triage Notes (Signed)
C/o upper abd burning since 1pm today and frontal headache x 1 hour.  Denies nausea, vomiting, and diarrhea.  Reports constipation x 2 days.  Small BM today after drinking prune juice.

## 2018-02-11 LAB — COMPREHENSIVE METABOLIC PANEL
ALT: 10 U/L — ABNORMAL LOW (ref 14–54)
AST: 21 U/L (ref 15–41)
Albumin: 3.8 g/dL (ref 3.5–5.0)
Alkaline Phosphatase: 115 U/L (ref 38–126)
Anion gap: 10 (ref 5–15)
BUN: 19 mg/dL (ref 6–20)
CHLORIDE: 105 mmol/L (ref 101–111)
CO2: 24 mmol/L (ref 22–32)
CREATININE: 0.78 mg/dL (ref 0.44–1.00)
Calcium: 9.2 mg/dL (ref 8.9–10.3)
GFR calc Af Amer: >60 mL/min (ref >60)
GFR calc non Af Amer: >60 mL/min (ref >60)
Glucose, Bld: 136 mg/dL — ABNORMAL HIGH (ref 65–99)
POTASSIUM: 3.6 mmol/L (ref 3.5–5.1)
SODIUM: 139 mmol/L (ref 135–145)
Total Bilirubin: 0.6 mg/dL (ref 0.3–1.2)
Total Protein: 7.1 g/dL (ref 6.5–8.1)

## 2018-02-11 LAB — CBC
HEMATOCRIT: 41.4 % (ref 36.0–46.0)
Hemoglobin: 13.6 g/dL (ref 12.0–15.0)
MCH: 28.9 pg (ref 26.0–34.0)
MCHC: 32.9 g/dL (ref 30.0–36.0)
MCV: 88.1 fL (ref 78.0–100.0)
PLATELETS: 239 10*3/uL (ref 150–400)
RBC: 4.7 MIL/uL (ref 3.87–5.11)
RDW: 11.9 % (ref 11.5–15.5)
WBC: 10.2 10*3/uL (ref 4.0–10.5)

## 2018-02-11 LAB — URINALYSIS, ROUTINE W REFLEX MICROSCOPIC
Bilirubin Urine: NEGATIVE
GLUCOSE, UA: NEGATIVE mg/dL
HGB URINE DIPSTICK: NEGATIVE
Ketones, ur: 20 mg/dL — AB
LEUKOCYTES UA: NEGATIVE
Nitrite: NEGATIVE
PH: 6 (ref 5.0–8.0)
PROTEIN: NEGATIVE mg/dL
Specific Gravity, Urine: 1.025 (ref 1.005–1.030)

## 2018-02-11 LAB — TROPONIN I: Troponin I: <0.03 ng/mL (ref <0.03)

## 2018-02-11 LAB — LIPASE, BLOOD: LIPASE: 23 U/L (ref 11–51)

## 2018-02-11 NOTE — ED Notes (Signed)
Patient left after triage

## 2019-11-24 ENCOUNTER — Ambulatory Visit: Payer: PRIVATE HEALTH INSURANCE | Attending: Internal Medicine

## 2019-11-24 DIAGNOSIS — Z23 Encounter for immunization: Secondary | ICD-10-CM

## 2019-11-24 NOTE — Progress Notes (Signed)
   Covid-19 Vaccination Clinic  Name:  Lisa Robinson    MRN: [id number] DOB: 18-Jul-1961  11/24/2019  Ms. Lisa Robinson was observed post Covid-19 immunization for 15 minutes without incident. She was provided with Vaccine Information Sheet and instruction to access the V-Safe system.   Ms. Lisa Robinson was instructed to call 911 with any severe reactions post vaccine: Marland Kitchen Difficulty breathing  . Swelling of face and throat  . A fast heartbeat  . A bad rash all over body  . Dizziness and weakness

## 2022-06-26 ENCOUNTER — Ambulatory Visit: Payer: PRIVATE HEALTH INSURANCE

## 2022-06-26 DIAGNOSIS — Z23 Encounter for immunization: Secondary | ICD-10-CM

## 2024-08-09 ENCOUNTER — Other Ambulatory Visit: Payer: Self-pay

## 2024-08-09 ENCOUNTER — Emergency Department (HOSPITAL_COMMUNITY)
Admission: EM | Admit: 2024-08-09 | Discharge: 2024-08-09 | Disposition: A | Discharge status: Home / Self Care | Payer: PRIVATE HEALTH INSURANCE | Attending: Emergency Medicine | Admitting: Emergency Medicine

## 2024-08-09 ENCOUNTER — Encounter (HOSPITAL_COMMUNITY): Payer: Self-pay

## 2024-08-09 ENCOUNTER — Emergency Department (HOSPITAL_COMMUNITY): Payer: PRIVATE HEALTH INSURANCE

## 2024-08-09 DIAGNOSIS — R0789 Other chest pain: Secondary | ICD-10-CM | POA: Diagnosis present

## 2024-08-09 DIAGNOSIS — Z7982 Long term (current) use of aspirin: Secondary | ICD-10-CM | POA: Insufficient documentation

## 2024-08-09 DIAGNOSIS — S20211A Contusion of right front wall of thorax, initial encounter: Secondary | ICD-10-CM | POA: Diagnosis not present

## 2024-08-09 DIAGNOSIS — Y92410 Unspecified street and highway as the place of occurrence of the external cause: Secondary | ICD-10-CM | POA: Diagnosis not present

## 2024-08-09 DIAGNOSIS — R1032 Left lower quadrant pain: Secondary | ICD-10-CM | POA: Insufficient documentation

## 2024-08-09 LAB — COMPREHENSIVE METABOLIC PANEL WITH GFR
ALT: 11 U/L (ref 0–44)
AST: 25 U/L (ref 15–41)
Albumin: 3.7 g/dL (ref 3.5–5.0)
Alkaline Phosphatase: 111 U/L (ref 38–126)
Anion gap: 9 (ref 5–15)
BUN: 15 mg/dL (ref 8–23)
CO2: 25 mmol/L (ref 22–32)
Calcium: 9.4 mg/dL (ref 8.9–10.3)
Chloride: 106 mmol/L (ref 98–111)
Creatinine, Ser: 0.73 mg/dL (ref 0.44–1.00)
GFR, Estimated: >60 mL/min (ref >60)
Glucose, Bld: 118 mg/dL — ABNORMAL HIGH (ref 70–99)
Potassium: 4.5 mmol/L (ref 3.5–5.1)
Sodium: 140 mmol/L (ref 135–145)
Total Bilirubin: 0.6 mg/dL (ref 0.0–1.2)
Total Protein: 7.4 g/dL (ref 6.5–8.1)

## 2024-08-09 LAB — CBC WITH DIFFERENTIAL/PLATELET
Abs Immature Granulocytes: 0.1 K/uL — ABNORMAL HIGH (ref 0.00–0.07)
Basophils Absolute: 0.1 K/uL (ref 0.0–0.1)
Basophils Relative: 1 %
Eosinophils Absolute: 0.2 K/uL (ref 0.0–0.5)
Eosinophils Relative: 2 %
HCT: 43.8 % (ref 36.0–46.0)
Hemoglobin: 14.5 g/dL (ref 12.0–15.0)
Immature Granulocytes: 1 %
Lymphocytes Relative: 13 %
Lymphs Abs: 1.5 K/uL (ref 0.7–4.0)
MCH: 29.2 pg (ref 26.0–34.0)
MCHC: 33.1 g/dL (ref 30.0–36.0)
MCV: 88.3 fL (ref 80.0–100.0)
Monocytes Absolute: 0.4 K/uL (ref 0.1–1.0)
Monocytes Relative: 4 %
Neutro Abs: 8.8 K/uL — ABNORMAL HIGH (ref 1.7–7.7)
Neutrophils Relative %: 79 %
Platelets: 277 K/uL (ref 150–400)
RBC: 4.96 MIL/uL (ref 3.87–5.11)
RDW: 11.9 % (ref 11.5–15.5)
WBC: 11 K/uL — ABNORMAL HIGH (ref 4.0–10.5)
nRBC: 0 % (ref 0.0–0.2)

## 2024-08-09 LAB — I-STAT CHEM 8, ED
BUN: 16 mg/dL (ref 8–23)
Calcium, Ion: 1.27 mmol/L (ref 1.15–1.40)
Chloride: 105 mmol/L (ref 98–111)
Creatinine, Ser: 0.8 mg/dL (ref 0.44–1.00)
Glucose, Bld: 118 mg/dL — ABNORMAL HIGH (ref 70–99)
HCT: 44 % (ref 36.0–46.0)
Hemoglobin: 15 g/dL (ref 12.0–15.0)
Potassium: 4.3 mmol/L (ref 3.5–5.1)
Sodium: 143 mmol/L (ref 135–145)
TCO2: 24 mmol/L (ref 22–32)

## 2024-08-09 LAB — TROPONIN I (HIGH SENSITIVITY): Troponin I (High Sensitivity): 5 ng/L (ref <18)

## 2024-08-09 MED ORDER — KETOROLAC TROMETHAMINE 15 MG/ML IJ SOLN
15.0000 mg | Freq: Once | INTRAMUSCULAR | Status: AC
  Administered 2024-08-09: 15 mg via INTRAVENOUS
  Filled 2024-08-09: qty 1

## 2024-08-09 MED ORDER — IOHEXOL 350 MG/ML SOLN
60.0000 mL | Freq: Once | INTRAVENOUS | Status: AC | PRN
  Administered 2024-08-09: 60 mL via INTRAVENOUS

## 2024-08-09 MED ORDER — FENTANYL CITRATE (PF) 50 MCG/ML IJ SOSY
50.0000 ug | PREFILLED_SYRINGE | Freq: Once | INTRAMUSCULAR | Status: AC
  Administered 2024-08-09: 50 ug via INTRAVENOUS
  Filled 2024-08-09: qty 1

## 2024-08-09 MED ORDER — NAPROXEN 500 MG PO TABS: 500.0000 mg | ORAL_TABLET | Freq: Two times a day (BID) | ORAL | 0 refills | Status: AC

## 2024-08-09 MED ORDER — METHOCARBAMOL 500 MG PO TABS: 500.0000 mg | ORAL_TABLET | Freq: Two times a day (BID) | ORAL | 0 refills | Status: AC
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
## 2024-08-09 NOTE — Discharge Instructions (Signed)
 Your workup overall today is very reassuring. I recommend taking Motrin  and Tylenol  at home to help with continued pain.
